# Patient Record
Sex: Female | Born: 1975 | Race: White | Hispanic: No | Marital: Married | Smoking: Never smoker
Health system: Southern US, Community
[De-identification: ages and names within clinical notes are randomized; demographics above are authoritative.]

## PROBLEM LIST (undated history)

## (undated) DIAGNOSIS — F32A Depression, unspecified: Secondary | ICD-10-CM

## (undated) DIAGNOSIS — F329 Major depressive disorder, single episode, unspecified: Secondary | ICD-10-CM

## (undated) HISTORY — PX: MOUTH SURGERY: SHX715

## (undated) HISTORY — PX: EXPLORATORY LAPAROTOMY: SUR591

---

## 2006-08-09 ENCOUNTER — Emergency Department (HOSPITAL_COMMUNITY): Admission: EM | Admit: 2006-08-09 | Discharge: 2006-08-09 | Payer: Self-pay | Admitting: Family Medicine

## 2007-03-13 ENCOUNTER — Emergency Department (HOSPITAL_COMMUNITY): Admission: EM | Admit: 2007-03-13 | Discharge: 2007-03-13 | Payer: Self-pay | Admitting: Emergency Medicine

## 2007-03-18 ENCOUNTER — Ambulatory Visit (HOSPITAL_COMMUNITY): Admission: RE | Admit: 2007-03-18 | Discharge: 2007-03-18 | Payer: Self-pay | Admitting: Family Medicine

## 2007-03-22 ENCOUNTER — Ambulatory Visit (HOSPITAL_COMMUNITY): Admission: RE | Admit: 2007-03-22 | Discharge: 2007-03-22 | Payer: Self-pay | Admitting: Family Medicine

## 2007-03-25 ENCOUNTER — Ambulatory Visit: Payer: Self-pay

## 2007-03-25 ENCOUNTER — Encounter (INDEPENDENT_AMBULATORY_CARE_PROVIDER_SITE_OTHER): Payer: Self-pay | Admitting: Family Medicine

## 2007-04-15 ENCOUNTER — Emergency Department (HOSPITAL_COMMUNITY): Admission: EM | Admit: 2007-04-15 | Discharge: 2007-04-15 | Payer: Self-pay | Admitting: Family Medicine

## 2007-07-28 ENCOUNTER — Ambulatory Visit (HOSPITAL_COMMUNITY): Admission: RE | Admit: 2007-07-28 | Discharge: 2007-07-28 | Payer: Self-pay | Admitting: Family Medicine

## 2008-08-08 ENCOUNTER — Ambulatory Visit (HOSPITAL_COMMUNITY): Admission: RE | Admit: 2008-08-08 | Discharge: 2008-08-08 | Payer: Self-pay | Admitting: Maternal and Fetal Medicine

## 2009-04-18 IMAGING — CR DG CHEST 2V
2 series · 2 of 2 positions shown · non-contrast
Comparison: none

CLINICAL DATA: Chest discomfort.  Shortness of breath.  Abnormal breath sounds.
 CHEST - 2 VIEW:

[view not recorded (1 of 2)]
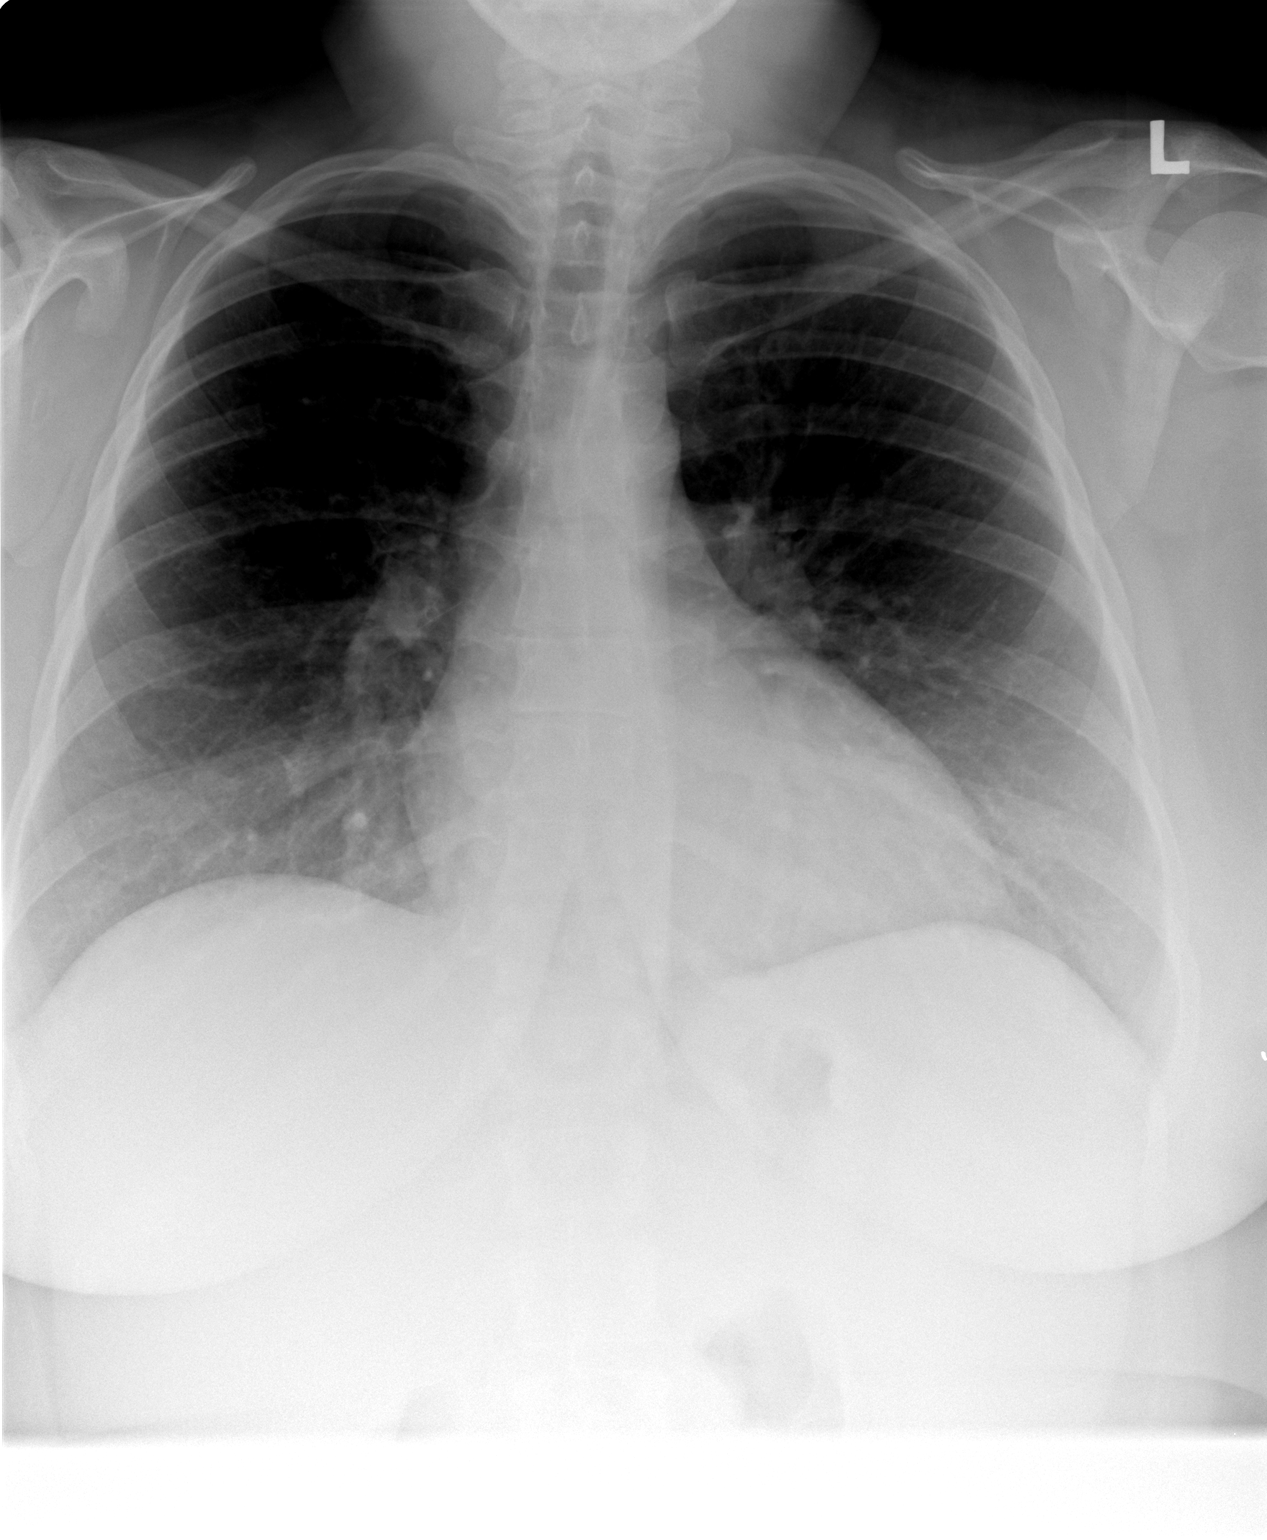

[view not recorded (2 of 2)]
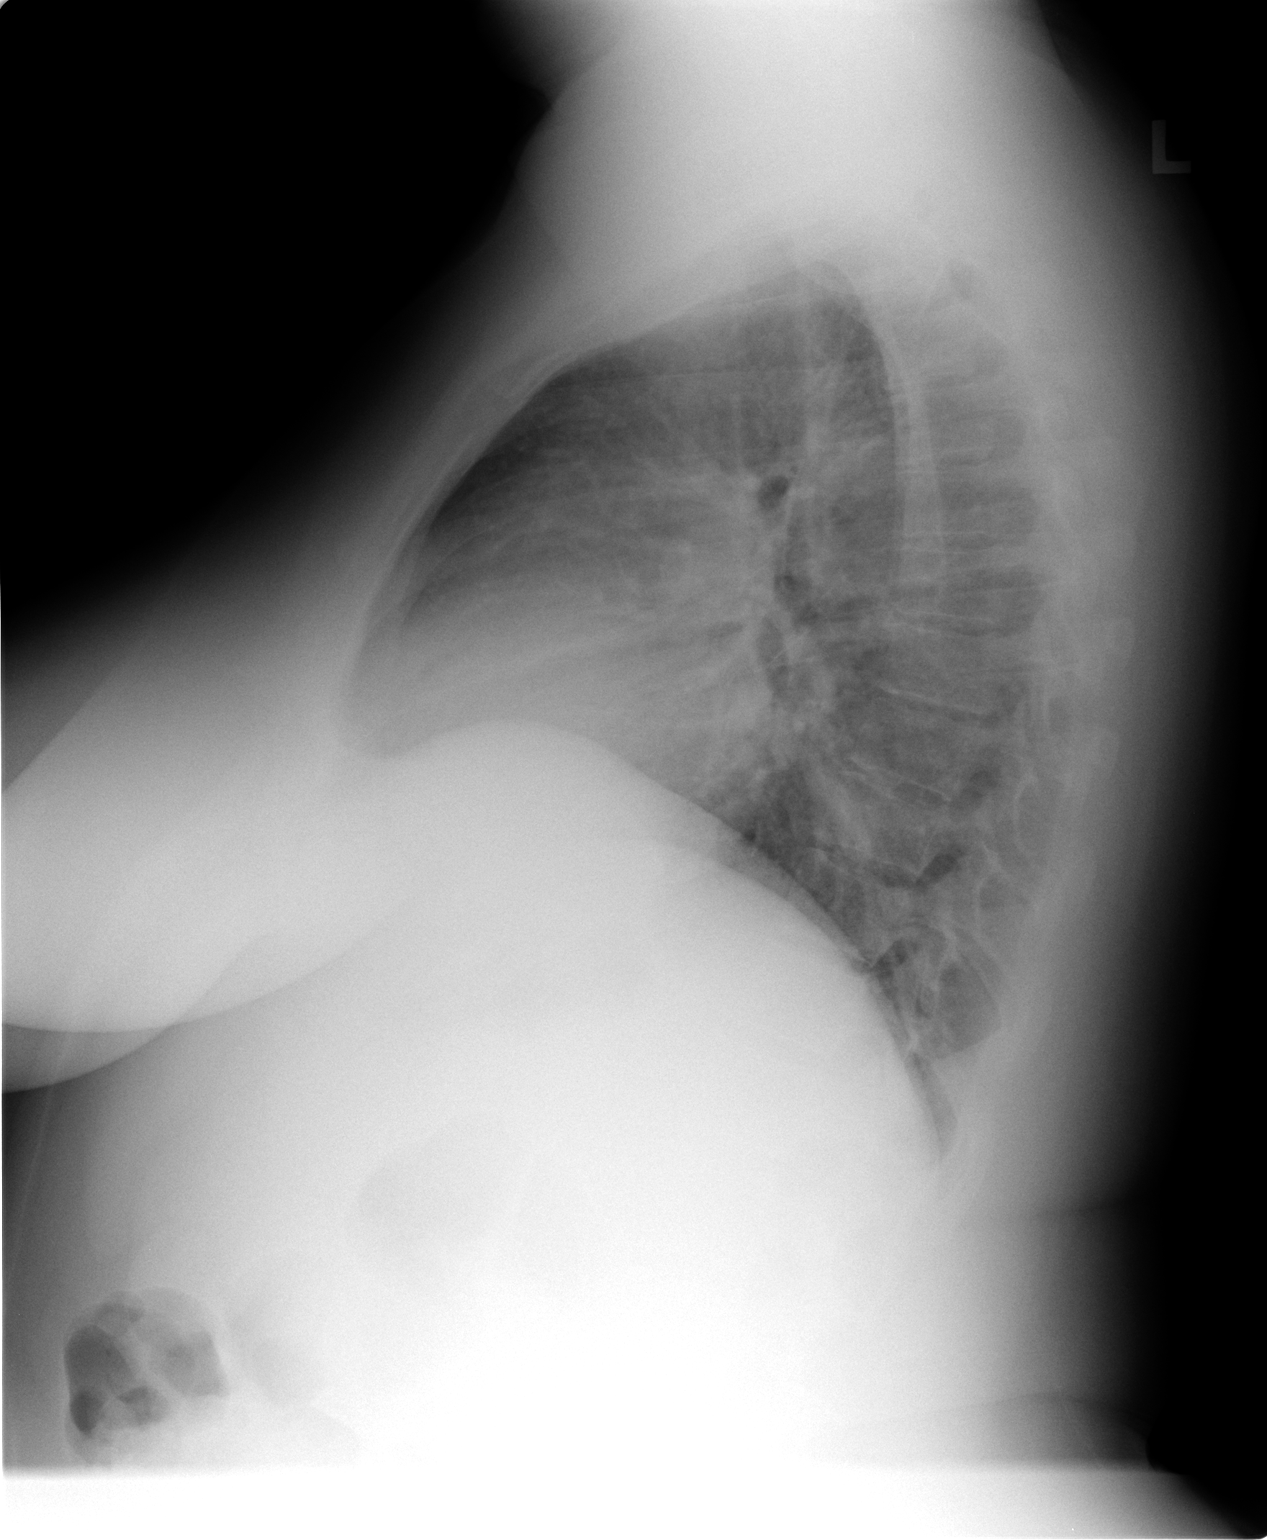

[2 of 2 positions shown; findings below may reference images not displayed]

FINDINGS: The heart size and mediastinal contours are within normal limits.  Both lungs are clear.  The visualized skeletal structures are unremarkable.
IMPRESSION: No active cardiopulmonary disease.

## 2009-08-28 IMAGING — US US PELVIS COMPLETE MODIFY
1 series · 14 of 25 positions shown · non-contrast
Comparison: None

CLINICAL DATA: Left lower quadrant pain.  History of fibroid
torsion.  History of ectopic gestation.

TRANSVAGINAL ULTRASOUND OF PELVIS,ULTRASOUND PELVIS COMPLETE -
MODIFY
TECHNIQUE: Transvaginal ultrasound examination of the pelvis was
performed including evaluation of the uterus, ovaries, adnexal
regions, and pelvic cul-de-sac,

[Series 1: us pelvis complete modify · 0.28mm/px · 14 of 37 slices shown]
[im 1/37]
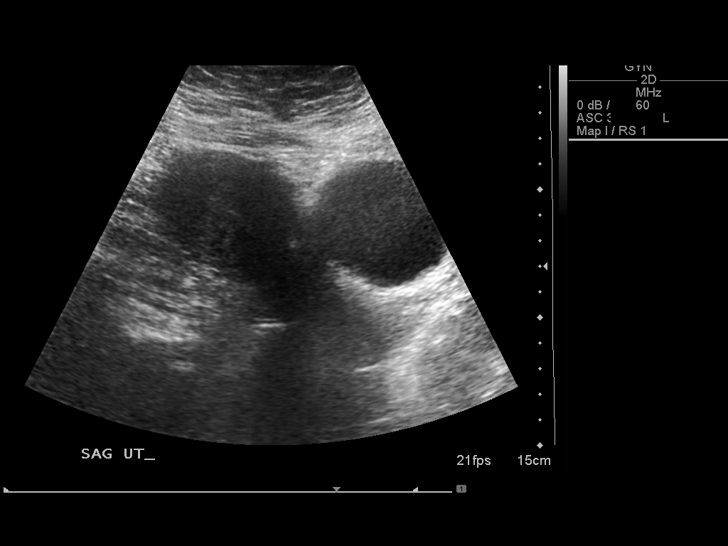
[im 4/37]
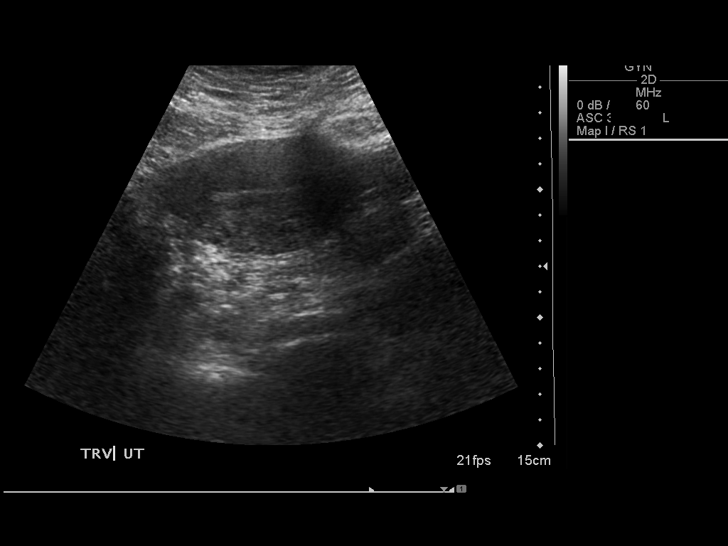
[im 7/37]
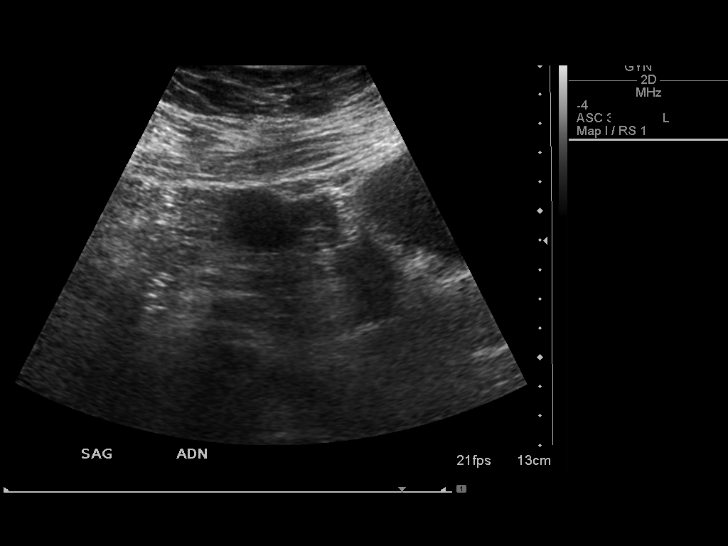
[im 10/37]
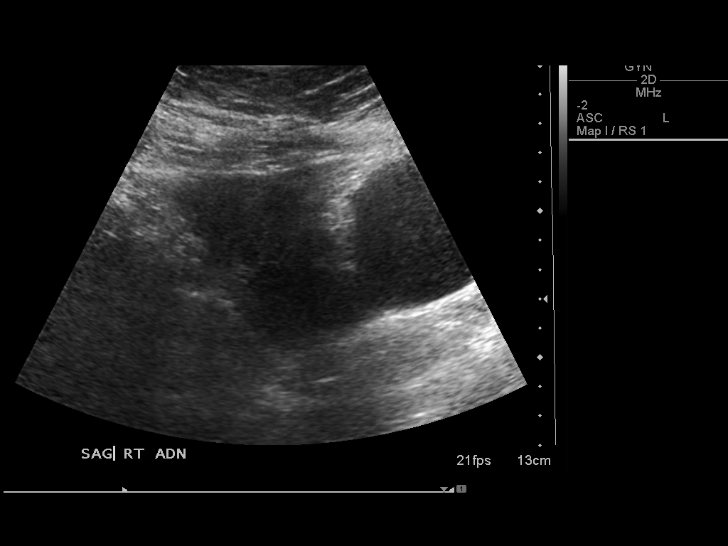
[im 13/37]
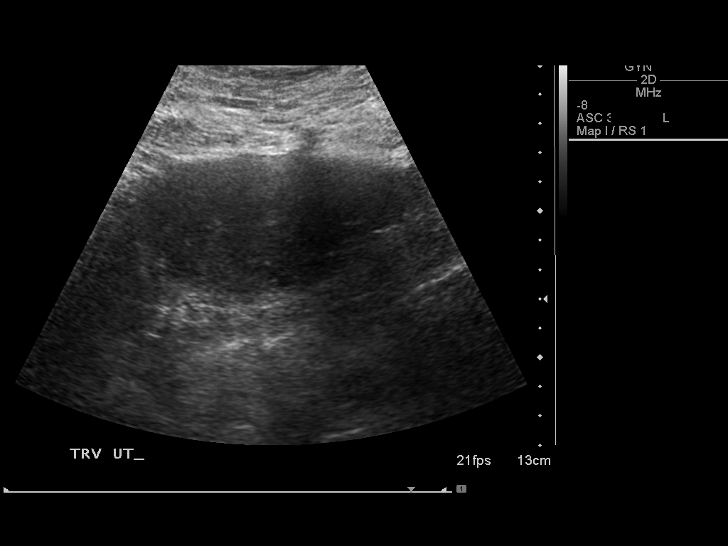
[im 14/37]
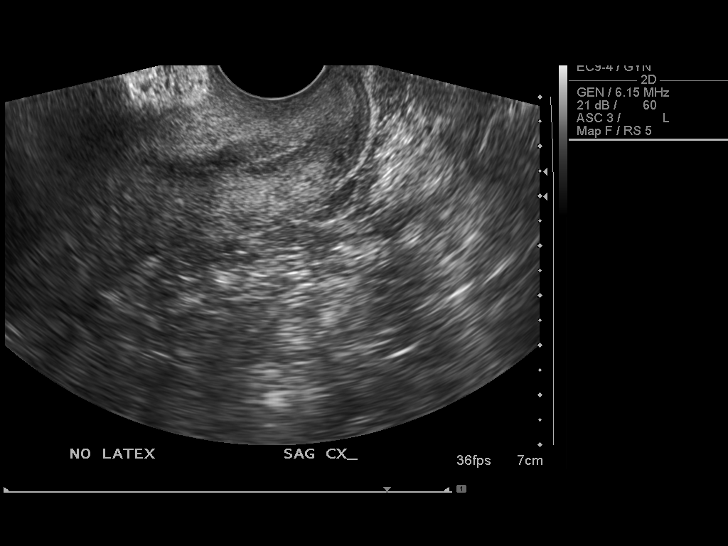
[im 17/37]
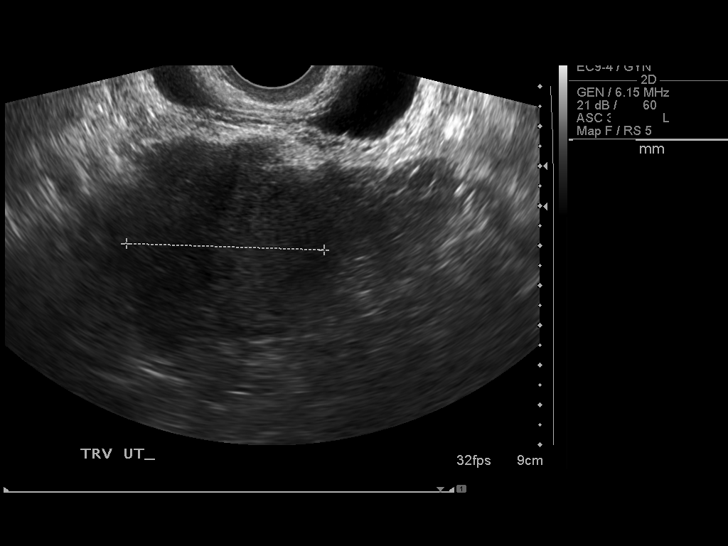
[im 20/37]
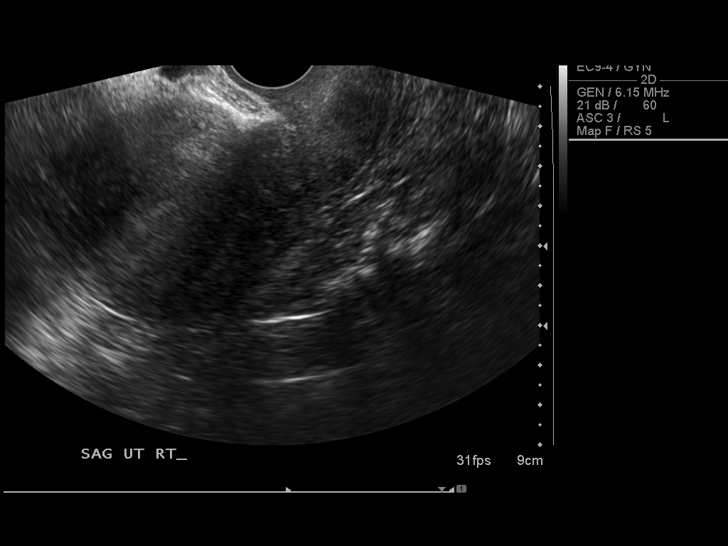
[im 23/37]
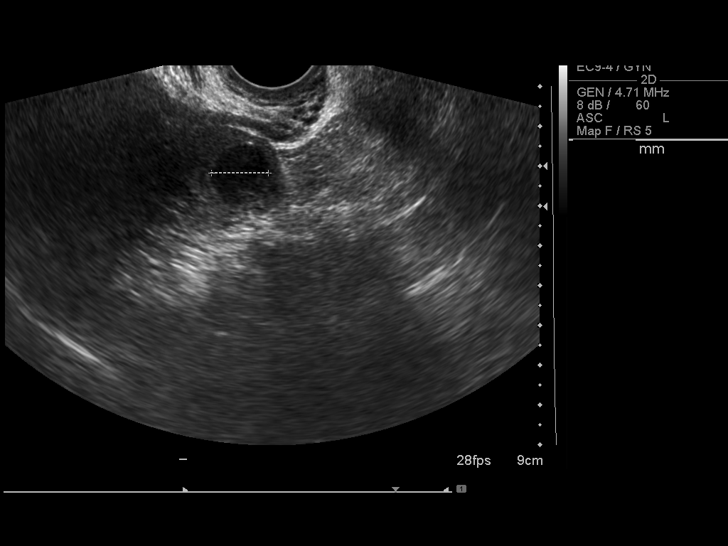
[im 25/37]
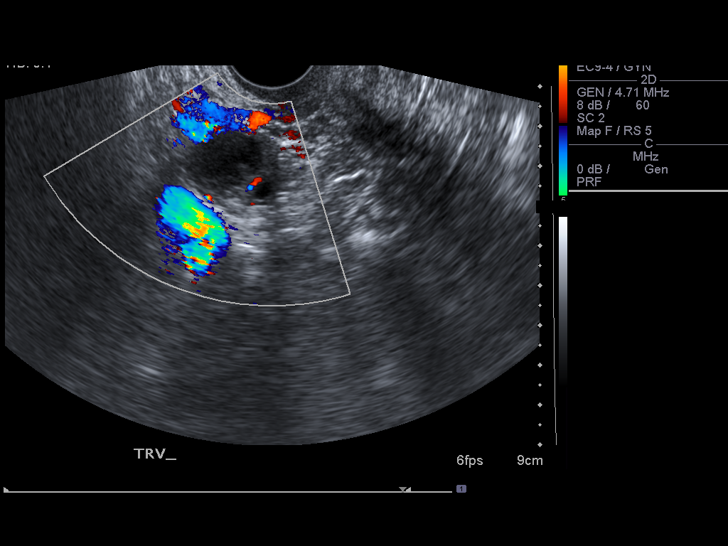
[im 28/37]
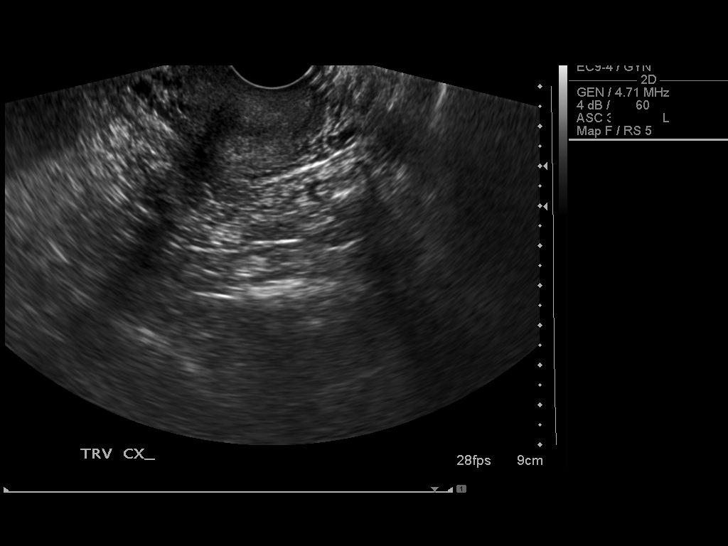
[im 31/37]
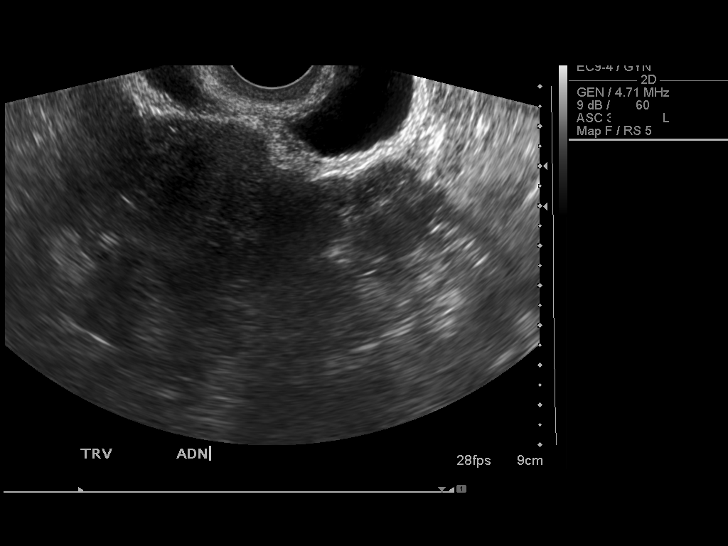
[im 34/37]
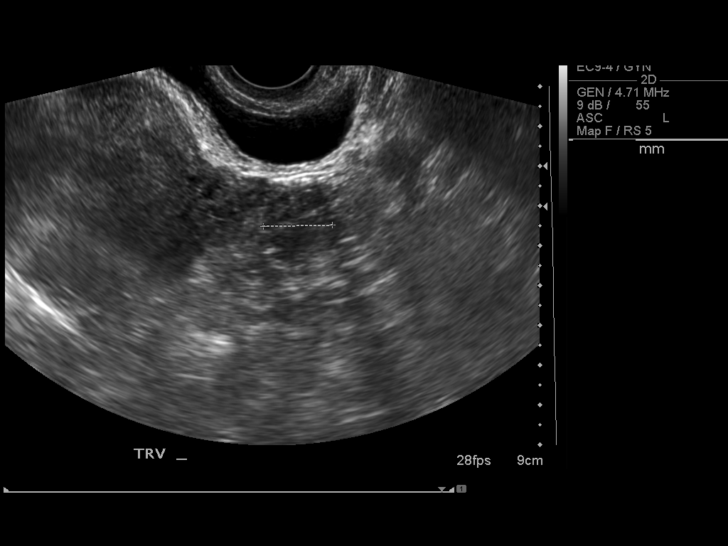
[im 37/37]
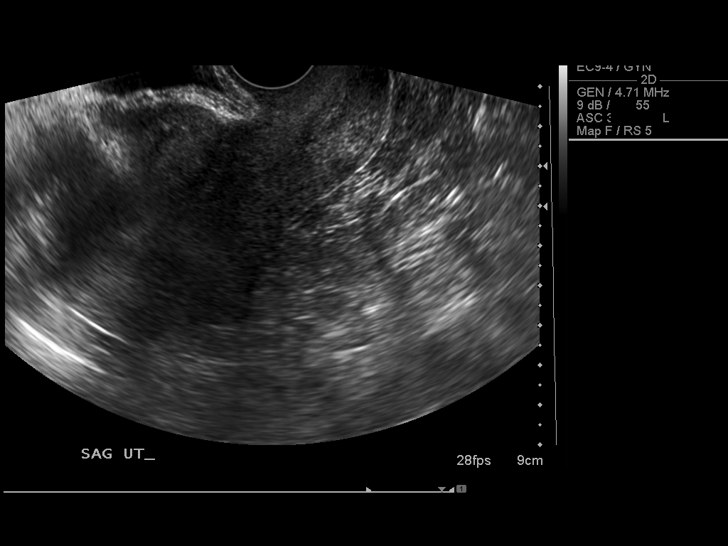

[14 of 25 positions shown; findings below may reference images not displayed]

FINDINGS: The uterus demonstrates a sagittal length of 9.1 cm, an
AP width of 4.9 cm, and a transverse width of 5.0 cm.  A
homogeneous uterine myometrium is seen.  The endometrial stripe is
thin and echogenic with an AP width of 3.8 mm.  No areas of focal
thickening or inhomogeneity are noted.

The right ovary measures 2.2 x 2.5 x 2.1 cm and contains a corpus
luteum cyst.  The left ovary measures 2.5 x 2.2 x 1.7 cm and has a
normal appearance.

No cul-de-sac or paraovarian fluid is noted.

It should be noted that the patient experienced discomfort with
scanning in the left lower quadrant lateral to the position of the
left ovary.
IMPRESSION: Normal pelvic ultrasound.  If the patients pain continues, further
evaluation with CT may be helpful to assess the non gynecological
portions of the pelvis.

## 2009-11-20 ENCOUNTER — Ambulatory Visit: Payer: Self-pay | Admitting: Emergency Medicine

## 2009-11-20 DIAGNOSIS — R110 Nausea: Secondary | ICD-10-CM

## 2009-11-20 LAB — CONVERTED CEMR LAB
Beta hcg, urine, semiquantitative: NEGATIVE
Specific Gravity, Urine: >=1.03
Urobilinogen, UA: 0.2

## 2009-11-21 ENCOUNTER — Encounter: Payer: Self-pay | Admitting: Emergency Medicine

## 2009-11-21 LAB — CONVERTED CEMR LAB
ALT: 15 units/L (ref 0–35)
AST: 16 units/L (ref 0–37)
Albumin: 4.6 g/dL (ref 3.5–5.2)
Alkaline Phosphatase: 60 units/L (ref 39–117)
BUN: 11 mg/dL (ref 6–23)
CO2: 24 meq/L (ref 19–32)
Calcium: 9.4 mg/dL (ref 8.4–10.5)
Creatinine, Ser: 0.59 mg/dL (ref 0.40–1.20)
Glucose, Bld: 91 mg/dL (ref 70–99)
Lipase: 26 units/L (ref 0–75)
Potassium: 4 meq/L (ref 3.5–5.3)
Total Bilirubin: 0.4 mg/dL (ref 0.3–1.2)
Total Protein: 6.6 g/dL (ref 6.0–8.3)

## 2010-05-04 ENCOUNTER — Encounter: Payer: Self-pay | Admitting: Family Medicine

## 2010-05-13 NOTE — Assessment & Plan Note (Signed)
Summary: NAUESA   Vital Signs:  Patient Profile:   35 Years Old Female CC:      Nausea x 1 month Height:     62 inches Weight:      240 pounds O2 Sat:      99 % O2 treatment:    Room Air Temp:     98.8 degrees F oral Pulse rate:   90 / minute Resp:     18 per minute BP sitting:   120 / 83  (right arm) Cuff size:   regular  Pt. in pain?   no  Vitals Entered By: Lajean Saver RN (November 20, 2009 3:17 PM)                   Updated Prior Medication List: No Medications Current Allergies: No known allergies History of Present Illness Chief Complaint: Nausea x 1 month History of Present Illness: 35 yo F here with 4-6 weeks of intermittent nausea.  Patient reports symptoms more persistent past 1-2 weeks.  No vomiting, diarrhea, bloody stool, melena.  Has taken two urine pregnancy tests that were negative.  Has an IUD as well.  Not much in the way of abdominal pain - mostly just nausea.  Has not tried any medications.  Sometimes worse after eating.  Has prior h/o 3 c-sections and known adhesions (told so at last c-section).  No h/o gall bladder issues or PUD.  No urinary symptoms.  Decreased appetite.  REVIEW OF SYSTEMS Constitutional Symptoms       Complains of fatigue.     Denies fever, chills, night sweats, weight loss, and weight gain.  Eyes       Denies change in vision, eye pain, eye discharge, glasses, contact lenses, and eye surgery. Ear/Nose/Throat/Mouth       Denies hearing loss/aids, change in hearing, ear pain, ear discharge, dizziness, frequent runny nose, frequent nose bleeds, sinus problems, sore throat, hoarseness, and tooth pain or bleeding.  Respiratory       Denies dry cough, productive cough, wheezing, shortness of breath, asthma, bronchitis, and emphysema/COPD.  Cardiovascular       Denies murmurs, chest pain, and tires easily with exhertion.    Gastrointestinal       Complains of nausea/vomiting.      Denies stomach pain, diarrhea, constipation, blood in  bowel movements, and indigestion.      Comments: no vomiting Genitourniary       Denies painful urination, kidney stones, and loss of urinary control. Neurological       Denies paralysis, seizures, and fainting/blackouts. Musculoskeletal       Denies muscle pain, joint pain, joint stiffness, decreased range of motion, redness, swelling, muscle weakness, and gout.  Skin       Denies bruising, unusual mles/lumps or sores, and hair/skin or nail changes.  Psych       Complains of anxiety/stress.      Denies mood changes, temper/anger issues, speech problems, depression, and sleep problems. Other Comments: Nausea x 1 month, occasional discomfort on left side. No other c/o pain, no vomiting, 2 negative home pregnancy test. Her PCP is unable to see her for another week.   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  No HSM.  Negative murphys sign.  BS normoactive. Assessment New Problems: NAUSEA (ICD-787.02) FAMILY HISTORY DIABETES 1ST DEGREE RELATIVE (ICD-V18.0)   Patient Education: Patient and/or caregiver instructed in the following: fluids. (pt seen by Dr. Pearletha Forge)  Plan New Medications/Changes: PROMETHAZINE HCL 25 MG TABS (PROMETHAZINE HCL) 1 tab by mouth q6h as needed nausea  #30 x 0, 11/20/2009, Norton Blizzard MD ZOFRAN 4 MG TABS (ONDANSETRON HCL) 1 tab by mouth q6h as needed nausea  #30 x 0, 11/20/2009, Norton Blizzard MD  New Orders: T-DG ABD 1 View  [74000] T-Lipase [id number] T-Comprehensive Metabolic Panel [80053-22900] T-CBC w/Diff [[id number]] New Patient Level III [29562] Planning Comments:   likely biliary colic, less likely peptic ulcer disease or partial SBO.  KUB without air-fluid levels or dilated bowel.  CBC with normal wbc count and hemoglobin.  Sent cmp and lipase - will contact patient with these results.  In meantime avoid fatty foods and dairy products when possible.  Trial otc prilosec on empty stomach daily as would normally treat PUD.  Keep appointment with pcp in 2 weeks for consideration of ordering RUQ ultrasound when fasting if still symptomatic.  If severe abd pain, fever, persistent vomiting, to seek care in ED.   The patient and/or caregiver has been counseled thoroughly with regard to medications prescribed including dosage, schedule, interactions, rationale for use, and possible side effects and they verbalize understanding.  Diagnoses and expected course of recovery discussed and will return if not improved as expected or if the condition worsens. Patient and/or caregiver verbalized understanding.  Prescriptions: PROMETHAZINE HCL 25 MG TABS (PROMETHAZINE HCL) 1 tab by mouth q6h as needed nausea  #30 x 0   Entered and Authorized by:   Norton Blizzard MD   Signed by:   Norton Blizzard MD on 11/20/2009   Method used:   Print then Give to Patient   RxID:   [id number] ZOFRAN 4 MG TABS (ONDANSETRON HCL) 1 tab by mouth q6h as needed nausea  #30 x 0   Entered and Authorized by:   Norton Blizzard MD   Signed by:   Norton Blizzard MD on 11/20/2009   Method used:   Print then Give to Patient   RxID:   [id number]   Patient Instructions: 1)  Patient's office number [phone], pager [phone]  Orders Added: 1)  T-DG ABD 1 View [74000] 2)  T-Lipase [[id number]] 3)  T-Comprehensive Metabolic Panel [[id number]] 4)  T-CBC w/Diff [[id number]] 5)  New Patient Level III [99203]  Laboratory Results   Urine  Tests  Date/Time Received: November 20, 2009 3:36 PM  Date/Time Reported: November 20, 2009 3:36 PM   Routine Urinalysis   Color: yellow Appearance: Clear Glucose: negative   (Normal Range: Negative) Bilirubin: negative   (Normal Range: Negative) Ketone: negative   (Normal Range: Negative) Spec. Gravity: >=1.030   (Normal Range: 1.003-1.035) Blood: negative   (Normal Range: Negative) pH: 6.0   (Normal Range: 5.0-8.0) Protein: trace   (Normal Range: Negative) Urobilinogen: 0.2   (Normal Range: 0-1) Nitrite: negative   (Normal Range: Negative) Leukocyte Esterace: negative   (Normal Range: Negative)    Urine HCG: negative

## 2011-01-20 LAB — POCT PREGNANCY, URINE
Operator id: 235561
Preg Test, Ur: NEGATIVE

## 2016-01-28 ENCOUNTER — Emergency Department
Admission: EM | Admit: 2016-01-28 | Discharge: 2016-01-28 | Disposition: A | Discharge status: Home / Self Care | Payer: PRIVATE HEALTH INSURANCE | Source: Home / Self Care | Attending: Family Medicine | Admitting: Family Medicine

## 2016-01-28 DIAGNOSIS — R002 Palpitations: Secondary | ICD-10-CM | POA: Diagnosis not present

## 2016-01-28 HISTORY — DX: Depression, unspecified: F32.A

## 2016-01-28 HISTORY — DX: Major depressive disorder, single episode, unspecified: F32.9

## 2016-01-28 NOTE — ED Provider Notes (Signed)
CSN: [id number]     Arrival date & time 01/28/16  1739 History   First MD Initiated Contact with Patient 01/28/16 1743     Chief Complaint  Patient presents with  . Palpitations   (Consider location/radiation/quality/duration/timing/severity/associated sxs/prior Treatment) HPI  Cynthia Mullen is a 40 y.o. female presenting to UC with c/o fluttering sensation in her chest for about 2 weeks now.  Palpitations are intermittent. Sensation causes pt to cough several times, cough is dry, palpitations then resolve.  Nothing seems to trigger the palpitations.  Denies pain or SOB. Denies nausea or diaphoresis with the palpitations.  She has cut back on caffeine and even stopped taking her Vyvanse for a few days w/o relief.  She is under increased stress and working more than usual. [facility], mild Left ventricular hypertrophy.  She is not on any cardiac or BP medications. She does not have a cardiologist.  She notes her insurance is about to change over in 2 weeks so she is hesitant to f/u with PCP as she may need to find a new one soon.     Past Medical History:  Diagnosis Date  . Depression    not currently   Past Surgical History:  Procedure Laterality Date  . CESAREAN SECTION     X3  . EXPLORATORY LAPAROTOMY    . MOUTH SURGERY     History reviewed. No pertinent family history. Social History  Substance Use Topics  . Smoking status: Never Smoker  . Smokeless tobacco: Never Used  . Alcohol use Yes     Comment: occasionally   OB History    No data available     Review of Systems  Constitutional: Negative for chills, diaphoresis, fatigue and fever.  Respiratory: Negative for cough, chest tightness, shortness of breath and wheezing.   Cardiovascular: Positive for chest pain ( discomfort with the palpitations ) and palpitations. Negative for leg swelling.  Gastrointestinal: Negative for nausea and vomiting.  Neurological: Negative  for dizziness, syncope and light-headedness.    Allergies  Adhesive [tape]  Home Medications   Prior to Admission medications   Medication Sig Start Date End Date Taking? Authorizing Provider  lisdexamfetamine (VYVANSE) 30 MG capsule Take 30 mg by mouth daily.   Yes Historical Provider, MD   Meds Ordered and Administered this Visit  Medications - No data to display  BP 114/82 (BP Location: Left Arm)   Pulse 91   Temp 98.1 F (36.7 C) (Oral)   Ht 5\' 1"  (1.549 m)   Wt 242 lb (109.8 kg)   SpO2 99%   BMI 45.73 kg/m  No data found.   Physical Exam  Constitutional: She is oriented to person, place, and time. She appears well-developed and well-nourished. No distress.  Pt sitting on exam bed, NAD.  HENT:  Head: Normocephalic and atraumatic.  Mouth/Throat: Oropharynx is clear and moist.  Eyes: EOM are normal.  Neck: Normal range of motion.  Cardiovascular: Normal rate, regular rhythm and normal heart sounds.  Exam reveals no gallop and no friction rub.   No murmur heard. Pulmonary/Chest: Effort normal and breath sounds normal. No respiratory distress. She has no wheezes. She has no rales. She exhibits no tenderness.  Musculoskeletal: Normal range of motion.  Neurological: She is alert and oriented to person, place, and time.  Skin: Skin is warm and dry. She is not diaphoretic.  Psychiatric: She has a normal mood and affect.  Her behavior is normal.  Nursing note and vitals reviewed.   Urgent Care Course   Clinical Course    Procedures (including critical care time)  Labs Review Labs Reviewed - No data to display  Imaging Review No results found.  Date/Time:01/28/2016   17:46:48 Ventricular Rate: 87 PR Interval: 158 QRS Duration: 90 QT Interval: 368 QTC Calculation: 442 P  R   T  Axis:  48   22    30 Text Interpretation: Normal Sinus Rhythm, Normal EKG    MDM   1. Palpitations    Pt c/o intermittent palpitations with cough for about 2 weeks.  Increased stress.  Pt appears well. NAD. Vitals: WNL EKG: Normal   Pt may benefit from a cardiac event monitor. No evidence of emergent process taking place at this time. Encouraged to continue to cut back on caffeine and get plenty of rest.  Encouraged f/u with PCP. Referral placed into Cardiology, advised to call the cardiology office next week if she does not hear from anyone by Monday about setting up a f/u appointment. Discussed symptoms that warrant emergent care in the ED. Patient verbalized understanding and agreement with treatment plan.     Junius Finner, PA-C 01/28/16 1906

## 2016-01-28 NOTE — ED Triage Notes (Signed)
Pt stated that she has felt a flutter for about 2 weeks now, and today it seems worse.  She can usually cough and it resolves.  Denies pain or discomfort.

## 2016-01-30 ENCOUNTER — Telehealth: Payer: Self-pay | Admitting: *Deleted

## 2016-01-30 NOTE — Telephone Encounter (Signed)
Callback: No answer, LMOM f/u from visit. Call back as needed. Encouraged f/u with PCP or cardiologist.
